# Patient Record
Sex: Female | Born: 2005 | Race: White | Hispanic: No | Marital: Single | Smoking: Never smoker
Health system: Southern US, Community
[De-identification: ages and names within clinical notes are randomized; demographics above are authoritative.]

---
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
## 2024-01-03 ENCOUNTER — Other Ambulatory Visit: Payer: MEDICAID

## 2024-01-05 ENCOUNTER — Ambulatory Visit: Payer: MEDICAID

## 2024-01-13 ENCOUNTER — Inpatient Hospital Stay: Payer: MEDICAID

## 2024-01-13 DIAGNOSIS — D500 Iron deficiency anemia secondary to blood loss (chronic): Secondary | ICD-10-CM | POA: Insufficient documentation

## 2024-01-13 DIAGNOSIS — K2100 Gastro-esophageal reflux disease with esophagitis, without bleeding: Secondary | ICD-10-CM | POA: Insufficient documentation

## 2024-01-13 DIAGNOSIS — E538 Deficiency of other specified B group vitamins: Secondary | ICD-10-CM

## 2024-01-13 LAB — CBC WITH DIFFERENTIAL (CANCER CENTER ONLY)
Abs Immature Granulocytes: 0.04 K/uL (ref 0.00–0.07)
Basophils Absolute: 0.1 K/uL (ref 0.0–0.1)
Basophils Relative: 1 %
Eosinophils Absolute: 0.1 K/uL (ref 0.0–0.5)
Eosinophils Relative: 1 %
HCT: 47.1 % — ABNORMAL HIGH (ref 36.0–46.0)
Hemoglobin: 15.4 g/dL — ABNORMAL HIGH (ref 12.0–15.0)
Immature Granulocytes: 1 %
Lymphocytes Relative: 24 %
Lymphs Abs: 2 K/uL (ref 0.7–4.0)
MCH: 26.3 pg (ref 26.0–34.0)
MCHC: 32.7 g/dL (ref 30.0–36.0)
MCV: 80.5 fL (ref 80.0–100.0)
Monocytes Absolute: 0.6 K/uL (ref 0.1–1.0)
Monocytes Relative: 7 %
Neutro Abs: 5.7 K/uL (ref 1.7–7.7)
Neutrophils Relative %: 66 %
Platelet Count: 258 K/uL (ref 150–400)
RBC: 5.85 MIL/uL — ABNORMAL HIGH (ref 3.87–5.11)
RDW: 17.6 % — ABNORMAL HIGH (ref 11.5–15.5)
WBC Count: 8.4 K/uL (ref 4.0–10.5)
nRBC: 0 % (ref 0.0–0.2)

## 2024-01-13 LAB — VITAMIN B12: Vitamin B-12: 661 pg/mL (ref 180–914)

## 2024-01-13 LAB — FOLATE: Folate: 17.3 ng/mL (ref >5.9)

## 2024-01-13 LAB — IRON AND IRON BINDING CAPACITY (CC-WL,HP ONLY)
Iron: 91 ug/dL (ref 28–170)
Saturation Ratios: 25 % (ref 10.4–31.8)
TIBC: 371 ug/dL (ref 250–450)
UIBC: 280 ug/dL

## 2024-01-13 LAB — FERRITIN: Ferritin: 121 ng/mL (ref 11–307)

## 2024-01-15 LAB — ANTI-PARIETAL ANTIBODY: Parietal Cell Antibody-IgG: 9.9 U (ref 0.0–20.0)

## 2024-01-15 LAB — INTRINSIC FACTOR ANTIBODIES: Intrinsic Factor: 1.1 [AU]/ml (ref 0.0–1.1)

## 2024-01-15 NOTE — Progress Notes (Unsigned)
 [facility] OFFICE PROGRESS NOTE  Patient Care Team: Mangel, Benison Pap, MD as PCP - General (Family Medicine)  Jodi Lee is a 18 y.o. female with history of iron  deficiency, PCOS, SIBO, erosive esophagitis secondary to GERD, vitamin D  deficiency being seen for iron  deficiency anemia.   Previous GI evaluation showed esophagitis.  History of GERD.  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  Outside records show iron  deficiency as of 2024.  Records show she had [facility]le[facility] at [facility] in May 2024.   Ferritin was 4.4 in July 2025 with worsening microcytosis.  After first visit on 9/9 received IV monoferric  on 9/23. Iron  deficiency and anemia resolved. Assessment & Plan   No orders of the defined types were placed in this encounter.    Pauletta JAYSON Chihuahua, MD  INTERVAL HISTORY: Patient returns for follow-up.  Oncology History   No problem history exists.     PHYSICAL EXAMINATION: ECOG PERFORMANCE STATUS: {CHL ONC ECOG [phone]}  There were no vitals filed for this visit. There were no vitals filed for this visit.  GENERAL: alert, no distress and comfortable SKIN: skin color normal and no jaundice or bruising or petechiae on exposed skin EYES: normal, sclera clear OROPHARYNX: no exudate  NECK: No palpable mass LYMPH:  no palpable cervical, axillary lymphadenopathy  LUNGS: clear to auscultation and no wheeze or rales with normal breathing effort HEART: regular rate & rhythm  ABDOMEN: abdomen soft, non-tender and nondistended. Musculoskeletal: no edema NEURO: no focal motor/sensory deficits  Relevant data reviewed during this visit included labs.  New labs ordered.

## 2024-01-16 ENCOUNTER — Ambulatory Visit: Payer: Self-pay

## 2024-01-16 ENCOUNTER — Inpatient Hospital Stay: Payer: MEDICAID

## 2024-01-16 VITALS — BP 121/82 | HR 122 | Temp 97.5°F | Resp 16 | Ht 60.5 in | Wt 156.0 lb

## 2024-01-16 DIAGNOSIS — E538 Deficiency of other specified B group vitamins: Secondary | ICD-10-CM | POA: Insufficient documentation

## 2024-01-16 DIAGNOSIS — D500 Iron deficiency anemia secondary to blood loss (chronic): Secondary | ICD-10-CM

## 2024-01-16 NOTE — Assessment & Plan Note (Addendum)
 May continue oral iron  Gummies 3-4 times a week Follow-up with PCP in 6 months to repeat ferritin and CBC

## 2024-01-16 NOTE — Assessment & Plan Note (Addendum)
 Intrinsic factor and antiparietal antibodies were negative. Continue B12 supplement with PCP and repeat in 6 months Repeat EGD if recurrent deficiency of oral supplements.
